# Patient Record
Sex: Male | Born: 1995 | Hispanic: No | Smoking: Never smoker
Health system: Southern US, Community
[De-identification: ages and names within clinical notes are randomized; demographics above are authoritative.]

## PROBLEM LIST (undated history)

## (undated) DIAGNOSIS — F39 Unspecified mood [affective] disorder: Secondary | ICD-10-CM

## (undated) DIAGNOSIS — F32A Depression, unspecified: Secondary | ICD-10-CM

## (undated) DIAGNOSIS — F419 Anxiety disorder, unspecified: Secondary | ICD-10-CM

## (undated) DIAGNOSIS — F329 Major depressive disorder, single episode, unspecified: Secondary | ICD-10-CM

## (undated) DIAGNOSIS — K222 Esophageal obstruction: Secondary | ICD-10-CM

## (undated) HISTORY — DX: Anxiety disorder, unspecified: F41.9

---
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
## 2017-03-12 ENCOUNTER — Emergency Department (HOSPITAL_COMMUNITY): Payer: Medicaid Other | Admitting: Anesthesiology

## 2017-03-12 ENCOUNTER — Emergency Department (HOSPITAL_BASED_OUTPATIENT_CLINIC_OR_DEPARTMENT_OTHER): Payer: Medicaid Other

## 2017-03-12 ENCOUNTER — Encounter (HOSPITAL_BASED_OUTPATIENT_CLINIC_OR_DEPARTMENT_OTHER): Payer: Self-pay | Admitting: *Deleted

## 2017-03-12 ENCOUNTER — Ambulatory Visit (HOSPITAL_BASED_OUTPATIENT_CLINIC_OR_DEPARTMENT_OTHER)
Admission: EM | Admit: 2017-03-12 | Discharge: 2017-03-12 | Disposition: A | Discharge status: Home / Self Care | Payer: Medicaid Other | Attending: Emergency Medicine | Admitting: Emergency Medicine

## 2017-03-12 ENCOUNTER — Encounter (HOSPITAL_COMMUNITY)
Admission: EM | Disposition: A | Discharge status: Home / Self Care | Payer: Self-pay | Source: Home / Self Care | Attending: Emergency Medicine

## 2017-03-12 DIAGNOSIS — K449 Diaphragmatic hernia without obstruction or gangrene: Secondary | ICD-10-CM | POA: Diagnosis not present

## 2017-03-12 DIAGNOSIS — K210 Gastro-esophageal reflux disease with esophagitis: Secondary | ICD-10-CM | POA: Insufficient documentation

## 2017-03-12 DIAGNOSIS — K222 Esophageal obstruction: Secondary | ICD-10-CM | POA: Diagnosis not present

## 2017-03-12 DIAGNOSIS — T18128A Food in esophagus causing other injury, initial encounter: Secondary | ICD-10-CM

## 2017-03-12 DIAGNOSIS — X58XXXA Exposure to other specified factors, initial encounter: Secondary | ICD-10-CM | POA: Insufficient documentation

## 2017-03-12 HISTORY — PX: ESOPHAGOGASTRODUODENOSCOPY: SHX5428

## 2017-03-12 HISTORY — DX: Esophageal obstruction: K22.2

## 2017-03-12 HISTORY — DX: Unspecified mood (affective) disorder: F39

## 2017-03-12 HISTORY — DX: Major depressive disorder, single episode, unspecified: F32.9

## 2017-03-12 HISTORY — DX: Depression, unspecified: F32.A

## 2017-03-12 SURGERY — EGD (ESOPHAGOGASTRODUODENOSCOPY)
Anesthesia: Monitor Anesthesia Care

## 2017-03-12 MED ORDER — GLUCAGON HCL RDNA (DIAGNOSTIC) 1 MG IJ SOLR
INTRAMUSCULAR | Status: AC
  Filled 2017-03-12: qty 1

## 2017-03-12 MED ORDER — PROPOFOL 10 MG/ML IV BOLUS
INTRAVENOUS | Status: AC
  Filled 2017-03-12: qty 20

## 2017-03-12 MED ORDER — PROPOFOL 500 MG/50ML IV EMUL
INTRAVENOUS | Status: DC | PRN
  Administered 2017-03-12: 120 ug/kg/min via INTRAVENOUS

## 2017-03-12 MED ORDER — PROPOFOL 10 MG/ML IV BOLUS
INTRAVENOUS | Status: DC | PRN
  Administered 2017-03-12: 20 mg via INTRAVENOUS
  Administered 2017-03-12 (×2): 40 mg via INTRAVENOUS
  Administered 2017-03-12: 20 mg via INTRAVENOUS
  Administered 2017-03-12 (×2): 40 mg via INTRAVENOUS
  Administered 2017-03-12 (×2): 20 mg via INTRAVENOUS

## 2017-03-12 MED ORDER — GLUCAGON HCL RDNA (DIAGNOSTIC) 1 MG IJ SOLR
1.0000 mg | Freq: Once | INTRAMUSCULAR | Status: AC
  Administered 2017-03-12: 1 mg via INTRAVENOUS

## 2017-03-12 MED ORDER — SODIUM CHLORIDE 0.9 % IV SOLN
INTRAVENOUS | Status: DC
  Administered 2017-03-12: 22:00:00 via INTRAVENOUS

## 2017-03-12 NOTE — Anesthesia Preprocedure Evaluation (Addendum)
Anesthesia Evaluation  Patient identified by MRN, date of birth, ID band Patient awake    Reviewed: Allergy & Precautions, NPO status , Unable to perform ROS - Chart review only  Airway Mallampati: II  TM Distance: >3 FB Neck ROM: Full    Dental  (+) Teeth Intact, Dental Advisory Given   Pulmonary    breath sounds clear to auscultation       Cardiovascular negative cardio ROS   Rhythm:Regular Rate:Normal     Neuro/Psych PSYCHIATRIC DISORDERS Anxiety Depression    GI/Hepatic negative GI ROS, Neg liver ROS,   Endo/Other  negative endocrine ROS  Renal/GU negative Renal ROS  negative genitourinary   Musculoskeletal negative musculoskeletal ROS (+)   Abdominal   Peds negative pediatric ROS (+)  Hematology negative hematology ROS (+)   Anesthesia Other Findings   Reproductive/Obstetrics negative OB ROS                            Anesthesia Physical Anesthesia Plan  ASA: II and emergent  Anesthesia Plan: MAC   Post-op Pain Management:    Induction: Intravenous  Airway Management Planned: Natural Airway  Additional Equipment:   Intra-op Plan:   Post-operative Plan:   Informed Consent: I have reviewed the patients History and Physical, chart, labs and discussed the procedure including the risks, benefits and alternatives for the proposed anesthesia with the patient or authorized representative who has indicated his/her understanding and acceptance.     Plan Discussed with: CRNA  Anesthesia Plan Comments:         Anesthesia Quick Evaluation

## 2017-03-12 NOTE — Anesthesia Postprocedure Evaluation (Addendum)
Anesthesia Post Note  Patient: Jacob Salazar  Procedure(s) Performed: Procedure(s) (LRB): ESOPHAGOGASTRODUODENOSCOPY (EGD) (N/A)  Patient location during evaluation: PACU Anesthesia Type: MAC Level of consciousness: awake and alert Pain management: pain level controlled Vital Signs Assessment: post-procedure vital signs reviewed and stable Respiratory status: spontaneous breathing, nonlabored ventilation, respiratory function stable and patient connected to nasal cannula oxygen Cardiovascular status: stable and blood pressure returned to baseline Anesthetic complications: no       Last Vitals:  Vitals:   03/12/17 2250 03/12/17 2300  BP: 122/60 (!) 127/57  Pulse: 96 92  Resp: (!) 23 18  Temp: 36.8 C     Last Pain:  Vitals:   03/12/17 2250  TempSrc: Oral  PainSc:                  Effie Berkshire

## 2017-03-12 NOTE — ED Triage Notes (Signed)
Pt reports steak stuck in throat since 1230. Spitting in cup. Speaking full sentences. No distress noted at nursefirst

## 2017-03-12 NOTE — ED Provider Notes (Signed)
Emergency Department Provider Note  By signing my name below, I, Soijett Blue, attest that this documentation has been prepared under the direction and in the presence of Maia Plan, MD. Electronically Signed: Soijett Blue, ED Scribe. 03/12/17. 7:58 PM.  I have reviewed the triage vital signs and the nursing notes.   HISTORY  Chief Complaint Esophageal Stricture   HPI Jacob Salazar is a 21 y.o. male with a PMHx of esophageal structure, who presents to the Emergency Department complaining of foreign body sensation in throat onset 6.5 hours ago. Pt reports associated trouble swallowing due to foreign body sensation. Pt has not tried any medications for the relief of his symptoms. He states that he was eating steak at a restaurant when a portion of the steak became lodged in his throat. Pt reports that he has a past hx of similar symptoms that resolved on its own without treatment or evaluation. He denies nausea and any other symptoms.    Past Medical History:  Diagnosis Date  . Anxiety   . Depression   . Esophageal stricture   . Mood disorder [facility])     Patient Active Problem List   Diagnosis Date Noted  . Anxiety, generalized 01/15/2012    History reviewed. No pertinent surgical history.    Allergies Grassleaf sweetflag rhizome  Family History  Problem Relation Age of Onset  . Depression Mother     Social History Social History  Substance Use Topics  . Smoking status: Never Smoker  . Smokeless tobacco: Never Used  . Alcohol use No    Review of Systems Constitutional: No fever/chills Eyes: No visual changes. ENT: +FB sensation in throat. +Trouble swallowing due to FB sensation. No sore throat. Cardiovascular: Denies chest pain. Respiratory: Denies shortness of breath. Gastrointestinal: No abdominal pain. No nausea, no vomiting. No diarrhea. No constipation. Genitourinary: Negative for dysuria. Musculoskeletal: Negative for back pain. Skin: Negative  for rash. Neurological: Negative for headaches, focal weakness or numbness.  10-point ROS otherwise negative.  ____________________________________________   PHYSICAL EXAM:  VITAL SIGNS: ED Triage Vitals  Enc Vitals Group     BP 03/12/17 1857 120/81     Pulse Rate 03/12/17 1857 87     Resp 03/12/17 1857 20     Temp 03/12/17 1857 98.5 F (36.9 C)     Temp Source 03/12/17 1857 Oral     SpO2 03/12/17 1857 100 %     Weight 03/12/17 1854 220 lb (99.8 kg)     Height 03/12/17 1854  (1.702 m)     Pain Score 03/12/17 1854 8   Constitutional: Alert and oriented. Well appearing and in no acute distress. Eyes: Conjunctivae are normal.  Head: Atraumatic. Nose: No congestion/rhinnorhea. Mouth/Throat: Mucous membranes are moist.  Oropharynx non-erythematous. Drooling into cup at bedside.  Neck: No stridor.  Voice is slightly muffled.  Cardiovascular: Normal rate, regular rhythm. Good peripheral circulation. Grossly normal heart sounds.   Respiratory: Normal respiratory effort.  No retractions. Lungs CTAB. Gastrointestinal: Soft and nontender. No distention.  Musculoskeletal: No lower extremity tenderness nor edema. No gross deformities of extremities. Neurologic:  Normal language. No gross focal neurologic deficits are appreciated.  Skin:  Skin is warm, dry and intact. No rash noted.  ____________________________________________  RADIOLOGY  No results found.  ____________________________________________   PROCEDURES  Procedure(s) performed:   Procedures  None ____________________________________________   INITIAL IMPRESSION / ASSESSMENT AND PLAN / ED COURSE  Pertinent labs & imaging results that were available during my care  of the patient were reviewed by me and considered in my medical decision making (see chart for details).  Patient presents to the ED with apparent food impaction. Patient unable to tolerate even oral secretions. No apparent airway involvement.  Plain film of neck is normal. Glucagon given and not successful. Will require transfer for endoscopy.   08:44 PM Spoke with GI, Dr. Ewing Schlein regarding the patient. Grandmother is here and will drive to [facility] endoscopy for evaluation. Discussed risk and benefit of transfer. Grandmother is comfortable driving. IV secured and directions to facility confirmed. Completed EMTALA.    ____________________________________________  FINAL CLINICAL IMPRESSION(S) / ED DIAGNOSES  Final diagnoses:  Food impaction of esophagus, initial encounter     MEDICATIONS GIVEN DURING THIS VISIT:  Medications  glucagon (human recombinant) (GLUCAGEN) injection 1 mg (1 mg Intravenous Given 03/12/17 1935)    I personally performed the services described in this documentation, which was scribed in my presence. The recorded information has been reviewed and is accurate.     Note:  This document was prepared using Dragon voice recognition software and may include unintentional dictation errors.  Alona Bene, MD Emergency Medicine    Maia Plan, MD 03/14/17 1440

## 2017-03-12 NOTE — ED Triage Notes (Signed)
He was eating steak and a piece got lodged in his esophagus 6 hours ago. He is unable to swallow saliva or water. Hx of same in the past.

## 2017-03-12 NOTE — ED Notes (Signed)
PT to go by POV to [facility] enod suite

## 2017-03-12 NOTE — Consult Note (Signed)
Reason for Consult: Food impaction Referring Physician: ER physician  Jacob Salazar is an 21 y.o. male.  HPI: Patient seen and examined and case discussed with him in multiple family members and he has a long history of dysphagia but has not had a test yet and has been on various pump inhibitors which have not been helpful but is always been able to get the food back up until now any last took a bite of country fried steak at 12:30 today and he went to the emergency room and glucagon was not helpful and we were called for consideration of endoscopy and he has no other complaints  Past Medical History:  Diagnosis Date  . Anxiety   . Depression   . Esophageal stricture   . Mood disorder (HCC)     History reviewed. No pertinent surgical history.  Family History  Problem Relation Age of Onset  . Depression Mother     Social History:  reports that he has never smoked. He has never used smokeless tobacco. He reports that he does not drink alcohol or use drugs.  Allergies:  Allergies  Allergen Reactions  . Grassleaf Sweetflag Rhizome     Medications: I have reviewed the patient's current medications.  No results found for this or any previous visit (from the past 48 hour(s)).  Dg Neck Soft Tissue  Result Date: 03/12/2017 CLINICAL DATA:  Patient swallowed a piece of steak and got stuck in esophagus. Patient is unable to swallow saliva and having esophageal pain. EXAM: NECK SOFT TISSUES - 1+ VIEW COMPARISON:  None. FINDINGS: There is no evidence of retropharyngeal soft tissue swelling or epiglottic enlargement. The cervical airway is unremarkable and no radio-opaque foreign body identified. IMPRESSION: Negative. Electronically Signed   By: Elberta Fortis M.D.   On: 03/12/2017 19:53    ROS negative except above Blood pressure 125/64, pulse 84, temperature 98.6 F (37 C), temperature source Oral, resp. rate (!) 24, height  (1.702 m), weight 99.8 kg (220 lb), SpO2 100 %. Physical  Exam vital signs stable afebrile no acute distress exam please see preassessment evaluation x-ray reviewed  Assessment/Plan: Food impaction in a patient with a long history of dysphagia but no workup to date Plan: The risks benefits and methods of endoscopy and removal was discussed with the patient and his family and will proceed now with anesthesia assistance with further workup and plans pending those findings  [name] E 03/12/2017, 10:26 PM

## 2017-03-12 NOTE — ED Notes (Signed)
Report given to [facility] ED , no answer at endo suite

## 2017-03-12 NOTE — Op Note (Signed)
[facility] Patient Name: Jacob Salazar Procedure Date: 03/12/2017 MRN: [id number] Attending MD: Vida Rigger , MD Date of Birth: November 07, 1996 CSN: [id number] Age: 21 Admit Type: Outpatient Procedure:                Upper GI endoscopy Indications:              Foreign body in the esophagus Providers:                Vida Rigger, MD, Harold Barban, RN, Beryle Beams,                            Technician Referring MD:              Medicines:                Propofol total dose 440 mg IV Complications:            No immediate complications. Estimated Blood Loss:     Estimated blood loss: none. Procedure:                Pre-Anesthesia Assessment:                           - Prior to the procedure, a History and Physical                            was performed, and patient medications and                            allergies were reviewed. The patient's tolerance of                            previous anesthesia was also reviewed. The risks                            and benefits of the procedure and the sedation                            options and risks were discussed with the patient.                            All questions were answered, and informed consent                            was obtained. Prior Anticoagulants: The patient has                            taken no previous anticoagulant or antiplatelet                            agents. ASA Grade Assessment: II - A patient with                            mild systemic disease. After reviewing the risks  and benefits, the patient was deemed in                            satisfactory condition to undergo the procedure.                           After obtaining informed consent, the endoscope was                            passed under direct vision. Throughout the                            procedure, the patient's blood pressure, pulse, and                            oxygen saturations  were monitored continuously. The                            was introduced through the mouth, and advanced to                            the second part of duodenum. The upper GI endoscopy                            was somewhat difficult due to presence of food. The                            patient tolerated the procedure fairly well. Scope In: Scope Out: Findings:      The larynx was normal.      Food was found in the proximal esophagus. Removal was accomplished with       a Lucina Mellow net -2 and snare.      A small hiatal hernia was present.      LA Grade A (one or more mucosal breaks less than 5 mm, not extending       between tops of 2 mucosal folds) esophagitis with no bleeding was found.      One mild benign-appearing, intrinsic stenosis was found. And was       traversed.      The entire examined stomach was normal.      The duodenal bulb, first portion of the duodenum and second portion of       the duodenum were normal.      The exam was otherwise without abnormality. Impression:               - Normal larynx.                           - Food was found in the esophagus. Removal was                            successful.                           - Small hiatal hernia.                           -  LA Grade A reflux esophagitis.                           - Benign-appearing esophageal stenosis.                           - Normal stomach.                           - Normal duodenal bulb, first portion of the                            duodenum and second portion of the duodenum.                           - The examination was otherwise normal. Moderate Sedation:      N/A- Per Anesthesia Care Recommendation:           - Patient has a contact number available for                            emergencies. The signs and symptoms of potential                            delayed complications were discussed with the                            patient. Return to normal activities tomorrow.                             Written discharge instructions were provided to the                            patient.                           - Clear liquid diet today.                           - Continue present medications.                           - Return to GI clinic in 2 weeks.                           - Telephone GI clinic if symptomatic PRN. Procedure Code(s):        --- Professional ---                           [id number], Esophagogastroduodenoscopy, flexible,                            transoral; with removal of foreign body(s) Diagnosis Code(s):        --- Professional ---                           V78.469G, Food in  esophagus causing other injury,                            initial encounter                           K44.9, Diaphragmatic hernia without obstruction or                            gangrene                           K21.0, Gastro-esophageal reflux disease with                            esophagitis                           K22.2, Esophageal obstruction                           T18.108A, Unspecified foreign body in esophagus                            causing other injury, initial encounter CPT copyright 2016 [employer]. All rights reserved. The codes documented in this report are preliminary and upon coder review may  be revised to meet current compliance requirements. Vida Rigger, MD 03/12/2017 10:55:13 PM This report has been signed electronically. Number of Addenda: 0

## 2017-03-12 NOTE — Discharge Instructions (Signed)
YOU HAD AN ENDOSCOPIC PROCEDURE TODAY: Refer to the procedure report and other information in the discharge instructions given to you for any specific questions about what was found during the examination. If this information does not answer your questions, please call [facility] office at [phone] to clarify.   YOU SHOULD EXPECT: Some feelings of bloating in the abdomen. Passage of more gas than usual. Walking can help get rid of the air that was put into your GI tract during the procedure and reduce the bloating. If you had a lower endoscopy (such as a colonoscopy or flexible sigmoidoscopy) you may notice spotting of blood in your stool or on the toilet paper. Some abdominal soreness may be present for a day or two, also.  DIET: Your first meal following the procedure should be a light meal and then it is ok to progress to your normal diet. A half-sandwich or bowl of soup is an example of a good first meal. Heavy or fried foods are harder to digest and may make you feel nauseous or bloated. Drink plenty of fluids but you should avoid alcoholic beverages for 24 hours. If you had a esophageal dilation, please see attached instructions for diet.   ACTIVITY: Your care partner should take you home directly after the procedure. You should plan to take it easy, moving slowly for the rest of the day. You can resume normal activity the day after the procedure however YOU SHOULD NOT DRIVE, use power tools, machinery or perform tasks that involve climbing or major physical exertion for 24 hours (because of the sedation medicines used during the test).   SYMPTOMS TO REPORT IMMEDIATELY: A gastroenterologist can be reached at any hour. Please call [phone]  for any of the following symptoms:  Following lower endoscopy (colonoscopy, flexible sigmoidoscopy) Excessive amounts of blood in the stool  Significant tenderness, worsening of abdominal pains  Swelling of the abdomen that is new, acute  Fever of 100 or  higher  Following upper endoscopy (EGD, EUS, ERCP, esophageal dilation) Vomiting of blood or coffee ground material  New, significant abdominal pain  New, significant chest pain or pain under the shoulder blades  Painful or persistently difficult swallowing  New shortness of breath  Black, tarry-looking or red, bloody stools  FOLLOW UP:  If any biopsies were taken you will be contacted by phone or by letter within the next 1-3 weeks. Call [phone]  if you have not heard about the biopsies in 3 weeks.  Please also call with any specific questions about appointments or follow up tests.   Liquids only today soft solids the rest of this weekend may advance diet next week eat slowly and chew foods well cut in small pieces drink plenty of liquids while eating and increase Nexium to twice a day and call me if needed otherwise follow-up in the office in a few weeks to set up a repeat endoscopy and probable dilation or set up an appointment with a local gastroenterologist ASAP

## 2017-03-12 NOTE — Transfer of Care (Signed)
Immediate Anesthesia Transfer of Care Note  Patient: Jacob Salazar  Procedure(s) Performed: Procedure(s): ESOPHAGOGASTRODUODENOSCOPY (EGD) (N/A)  Patient Location: PACU  Anesthesia Type:General  Level of Consciousness:  sedated, patient cooperative and responds to stimulation  Airway & Oxygen Therapy:Patient Spontanous Breathing and Patient connected to face mask oxgen  Post-op Assessment:  Report given to PACU RN and Post -op Vital signs reviewed and stable  Post vital signs:  Reviewed and stable  Last Vitals:  Vitals:   03/12/17 2214 03/12/17 2250  BP: 125/64 122/60  Pulse: 84 96  Resp: (!) 24 (!) 23  Temp: 37 C     Complications: No apparent anesthesia complications

## 2017-03-16 ENCOUNTER — Encounter (HOSPITAL_COMMUNITY): Payer: Self-pay | Admitting: Gastroenterology

## 2017-05-07 NOTE — Addendum Note (Signed)
Addendum  created 05/07/17 1223 by Hollis, Kevin D, MD   Sign clinical note    

## 2017-10-23 ENCOUNTER — Emergency Department (HOSPITAL_COMMUNITY)
Admission: EM | Admit: 2017-10-23 | Discharge: 2017-10-23 | Disposition: A | Discharge status: Home / Self Care | Payer: 59 | Attending: Emergency Medicine | Admitting: Emergency Medicine

## 2017-10-23 ENCOUNTER — Encounter (HOSPITAL_COMMUNITY): Payer: Self-pay | Admitting: Emergency Medicine

## 2017-10-23 ENCOUNTER — Other Ambulatory Visit: Payer: Self-pay

## 2017-10-23 DIAGNOSIS — Y929 Unspecified place or not applicable: Secondary | ICD-10-CM | POA: Diagnosis not present

## 2017-10-23 DIAGNOSIS — T18128A Food in esophagus causing other injury, initial encounter: Secondary | ICD-10-CM | POA: Insufficient documentation

## 2017-10-23 DIAGNOSIS — Z79899 Other long term (current) drug therapy: Secondary | ICD-10-CM | POA: Diagnosis not present

## 2017-10-23 DIAGNOSIS — X58XXXA Exposure to other specified factors, initial encounter: Secondary | ICD-10-CM | POA: Insufficient documentation

## 2017-10-23 DIAGNOSIS — Z91040 Latex allergy status: Secondary | ICD-10-CM | POA: Diagnosis not present

## 2017-10-23 DIAGNOSIS — Y9389 Activity, other specified: Secondary | ICD-10-CM | POA: Insufficient documentation

## 2017-10-23 DIAGNOSIS — Z8719 Personal history of other diseases of the digestive system: Secondary | ICD-10-CM | POA: Insufficient documentation

## 2017-10-23 DIAGNOSIS — R0989 Other specified symptoms and signs involving the circulatory and respiratory systems: Secondary | ICD-10-CM | POA: Diagnosis not present

## 2017-10-23 DIAGNOSIS — Y998 Other external cause status: Secondary | ICD-10-CM | POA: Insufficient documentation

## 2017-10-23 NOTE — ED Triage Notes (Signed)
Patient has a hx of having steak stuck in his throat. Patient went to a restaurant around 5:30 pm. Patient states it feels like something is stuck in his throat and hurts while he breathes.

## 2017-10-23 NOTE — ED Notes (Signed)
States feeling better able to drink water with vomiting it back up, no respiratory or acute distress noted alert and oriented x 3 visitor at bedside. No drooling noted clear speech noted.

## 2017-10-23 NOTE — ED Notes (Signed)
Pt left without receiving discharge instructions or having vitals rechecked.

## 2017-10-23 NOTE — ED Provider Notes (Signed)
[facility]-EMERGENCY DEPT Provider Note   CSN: [id number] Arrival date & time: 10/23/17  1813     History   Chief Complaint Chief Complaint  Patient presents with  . rib stuck in throat    HPI Jacob Salazar is a 21 y.o. male.  21 year old male with history of anxiety/depression and esophageal stricture who presents with food stuck in his throat.  This evening around 5:30 PM, the patient took a bite of red meat and accidentally swallowed it before chewing.  He felt like the food got stuck in his throat and was unable to go down.  The pain was worse when he took a breath in.  Initially he was having to spit out his secretions.  While sitting in the ED, he eventually felt his symptoms resolved.  Now he feels well and no longer has the foreign body sensation in his throat.  He has been able to drink water here without problems and no vomiting.  Mom notes that he has had this problem previously, last year had a piece of steak stuck in his throat and had to have endoscopy.  No problems since then.   The history is provided by the patient.    Past Medical History:  Diagnosis Date  . Anxiety   . Depression   . Esophageal stricture   . Mood disorder [facility](HCC)     Patient Active Problem List   Diagnosis Date Noted  . Anxiety, generalized 01/15/2012    Past Surgical History:  Procedure Laterality Date  . ESOPHAGOGASTRODUODENOSCOPY (EGD) N/A 03/12/2017   Performed [facility] Vida RiggerMagod, Marc, [facility]       Home Medications    Prior to Admission medications   Medication Sig Start Date End Date Taking? Authorizing Provider  ARIPiprazole (ABILIFY) 10 MG tablet Take 10 mg by mouth daily.   Yes [provider]  cetirizine (ZYRTEC) 10 MG tablet Take 10 mg by mouth daily.   Yes [provider]  esomeprazole (NEXIUM) 40 MG capsule Take 40 mg by mouth daily at 12 noon.   Yes [provider]  lamoTRIgine (LAMICTAL) 100 MG tablet Take 100 mg  by mouth daily.   Yes [provider]  traZODone (DESYREL) 100 MG tablet Take 150 mg at bedtime by mouth. 10/15/17  Yes [provider]    Family History Family History  Problem Relation Age of Onset  . Depression Mother     Social History Social History   Tobacco Use  . Smoking status: Never Smoker  . Smokeless tobacco: Never Used  Substance Use Topics  . Alcohol use: No  . Drug use: No     Allergies   Grassleaf sweetflag rhizome and Latex   Review of Systems Review of Systems All other systems reviewed and are negative except that which was mentioned in HPI   Physical Exam Updated Vital Signs BP 127/66 (BP Location: Left Arm)   Pulse 90   Temp 98 F (36.7 C) (Oral)   Resp 18   Ht 5\' 6"  (1.676 m)   Wt 104.3 kg (230 lb)   SpO2 98%   BMI 37.12 kg/m   Physical Exam  Constitutional: He is oriented to person, place, and time. He appears well-developed and well-nourished. No distress.  HENT:  Head: Normocephalic and atraumatic.  Mouth/Throat: Oropharynx is clear and moist.  Moist mucous membranes  Eyes: Conjunctivae are normal. Pupils are equal, round, and reactive to light.  Neck: Neck supple.  Cardiovascular: Normal  rate, regular rhythm and normal heart sounds.  No murmur heard. Pulmonary/Chest: Effort normal and breath sounds normal.  Abdominal: Soft. Bowel sounds are normal. He exhibits no distension. There is no tenderness.  Musculoskeletal: He exhibits no edema.  Neurological: He is alert and oriented to person, place, and time.  Fluent speech  Skin: Skin is warm and dry.  Psychiatric: He has a normal mood and affect. Judgment normal.  Nursing note and vitals reviewed.    ED Treatments / Results  Labs (all labs ordered are listed, but only abnormal results are displayed) Labs Reviewed - No data to display  EKG  EKG Interpretation None       Radiology No results found.  Procedures Procedures (including critical care  time)  Medications Ordered in ED Medications - No data to display   Initial Impression / Assessment and Plan / ED Course  I have reviewed the triage vital signs and the nursing notes.     PT w/ food impaction that resolved while ED without intervention.  He was well-appearing, tolerating secretions, and drank a cup of water with no issues.  Extensively educated the patient on the importance of cutting food into small bites and being very cautious with meats.  Instructed to follow-up with gastroenterology as needed.  Return precautions given.  Final Clinical Impressions(s) / ED Diagnoses   Final diagnoses:  Food impaction of esophagus, initial encounter    ED Discharge Orders    None       Quinley Nesler, Ambrose Finlandachel Morgan, MD 10/23/17 2052

## 2018-02-27 ENCOUNTER — Emergency Department (HOSPITAL_COMMUNITY): Payer: 59 | Admitting: Anesthesiology

## 2018-02-27 ENCOUNTER — Other Ambulatory Visit: Payer: Self-pay

## 2018-02-27 ENCOUNTER — Emergency Department (HOSPITAL_COMMUNITY)
Admission: EM | Admit: 2018-02-27 | Discharge: 2018-02-27 | Disposition: A | Discharge status: Home / Self Care | Payer: 59 | Attending: Emergency Medicine | Admitting: Emergency Medicine

## 2018-02-27 ENCOUNTER — Encounter (HOSPITAL_COMMUNITY): Payer: Self-pay | Admitting: *Deleted

## 2018-02-27 ENCOUNTER — Encounter (HOSPITAL_COMMUNITY)
Admission: EM | Disposition: A | Discharge status: Home / Self Care | Payer: Self-pay | Source: Home / Self Care | Attending: Emergency Medicine

## 2018-02-27 DIAGNOSIS — R1310 Dysphagia, unspecified: Secondary | ICD-10-CM

## 2018-02-27 DIAGNOSIS — F840 Autistic disorder: Secondary | ICD-10-CM | POA: Insufficient documentation

## 2018-02-27 DIAGNOSIS — K222 Esophageal obstruction: Secondary | ICD-10-CM | POA: Diagnosis not present

## 2018-02-27 DIAGNOSIS — Z79899 Other long term (current) drug therapy: Secondary | ICD-10-CM | POA: Diagnosis not present

## 2018-02-27 DIAGNOSIS — X58XXXA Exposure to other specified factors, initial encounter: Secondary | ICD-10-CM | POA: Insufficient documentation

## 2018-02-27 DIAGNOSIS — F411 Generalized anxiety disorder: Secondary | ICD-10-CM | POA: Diagnosis not present

## 2018-02-27 DIAGNOSIS — F319 Bipolar disorder, unspecified: Secondary | ICD-10-CM | POA: Insufficient documentation

## 2018-02-27 DIAGNOSIS — T18128A Food in esophagus causing other injury, initial encounter: Secondary | ICD-10-CM | POA: Diagnosis not present

## 2018-02-27 DIAGNOSIS — K209 Esophagitis, unspecified: Secondary | ICD-10-CM | POA: Diagnosis not present

## 2018-02-27 HISTORY — PX: ESOPHAGOGASTRODUODENOSCOPY: SHX5428

## 2018-02-27 SURGERY — EGD (ESOPHAGOGASTRODUODENOSCOPY)
Anesthesia: General

## 2018-02-27 MED ORDER — PHENYLEPHRINE 40 MCG/ML (10ML) SYRINGE FOR IV PUSH (FOR BLOOD PRESSURE SUPPORT)
PREFILLED_SYRINGE | INTRAVENOUS | Status: DC | PRN
  Administered 2018-02-27: 120 ug via INTRAVENOUS
  Administered 2018-02-27: 80 ug via INTRAVENOUS

## 2018-02-27 MED ORDER — GLUCAGON HCL RDNA (DIAGNOSTIC) 1 MG IJ SOLR
1.0000 mg | INTRAMUSCULAR | Status: AC | PRN
  Administered 2018-02-27 (×2): 1 mg via INTRAVENOUS
  Filled 2018-02-27 (×2): qty 1

## 2018-02-27 MED ORDER — SODIUM CHLORIDE 0.9 % IV SOLN: INTRAVENOUS | Status: DC

## 2018-02-27 MED ORDER — SUGAMMADEX SODIUM 200 MG/2ML IV SOLN
INTRAVENOUS | Status: DC | PRN
  Administered 2018-02-27: 100 mg via INTRAVENOUS

## 2018-02-27 MED ORDER — DEXAMETHASONE SODIUM PHOSPHATE 10 MG/ML IJ SOLN
INTRAMUSCULAR | Status: DC | PRN
  Administered 2018-02-27: 10 mg via INTRAVENOUS

## 2018-02-27 MED ORDER — PROPOFOL 10 MG/ML IV BOLUS
INTRAVENOUS | Status: DC | PRN
  Administered 2018-02-27: 200 mg via INTRAVENOUS

## 2018-02-27 MED ORDER — SUCCINYLCHOLINE CHLORIDE 200 MG/10ML IV SOSY
PREFILLED_SYRINGE | INTRAVENOUS | Status: DC | PRN
  Administered 2018-02-27: 100 mg via INTRAVENOUS

## 2018-02-27 MED ORDER — FENTANYL CITRATE (PF) 100 MCG/2ML IJ SOLN
INTRAMUSCULAR | Status: AC
  Filled 2018-02-27: qty 2

## 2018-02-27 MED ORDER — LIDOCAINE 2% (20 MG/ML) 5 ML SYRINGE
INTRAMUSCULAR | Status: DC | PRN
  Administered 2018-02-27: 100 mg via INTRAVENOUS

## 2018-02-27 MED ORDER — PROPOFOL 10 MG/ML IV BOLUS
INTRAVENOUS | Status: AC
  Filled 2018-02-27: qty 40

## 2018-02-27 MED ORDER — SODIUM CHLORIDE 0.9 % IV SOLN
INTRAVENOUS | Status: DC
  Administered 2018-02-27 (×2): via INTRAVENOUS

## 2018-02-27 MED ORDER — ROCURONIUM BROMIDE 10 MG/ML (PF) SYRINGE
PREFILLED_SYRINGE | INTRAVENOUS | Status: DC | PRN
  Administered 2018-02-27: 10 mg via INTRAVENOUS

## 2018-02-27 MED ORDER — ONDANSETRON HCL 4 MG/2ML IJ SOLN
INTRAMUSCULAR | Status: DC | PRN
  Administered 2018-02-27: 4 mg via INTRAVENOUS

## 2018-02-27 MED ORDER — FENTANYL CITRATE (PF) 250 MCG/5ML IJ SOLN
INTRAMUSCULAR | Status: DC | PRN
  Administered 2018-02-27: 50 ug via INTRAVENOUS

## 2018-02-27 MED ORDER — MIDAZOLAM HCL 2 MG/2ML IJ SOLN
INTRAMUSCULAR | Status: AC
  Filled 2018-02-27: qty 2

## 2018-02-27 NOTE — ED Notes (Signed)
Report given to Casimiro NeedleMichael, PACU RN. He is assuming care of pt until endo can perform his procedure.

## 2018-02-27 NOTE — ED Provider Notes (Signed)
[facility]-EMERGENCY DEPT Provider Note   CSN: [id number] Arrival date & time: 02/27/18  1332   The patient presents for evaluation of difficulty swallowing which started around noon today after eating a chicken pot.  Since that time he has been spitting up saliva, and unable to swallow.  He has had this recurrently.  No other recent illnesses including fever, chills, cough, chest pain, weakness dizziness.  He is here with his mother who gives history, because "he has an intellectual disability," my mother's report.  She also reports that he has had to have esophageal food bolus extractions multiple times.  He has esophagitis for which she is taking Nexium, twice a day.  There are no other known modifying factors.      History   Chief Complaint Chief Complaint  Patient presents with  . food bolus    HPI Jacob Salazar is a 22 y.o. male.  HPI  Past Medical History:  Diagnosis Date  . Anxiety   . Depression   . Esophageal stricture   . Mood disorder [facility])     Patient Active Problem List   Diagnosis Date Noted  . Anxiety, generalized 01/15/2012    Past Surgical History:  Procedure Laterality Date  . ESOPHAGOGASTRODUODENOSCOPY N/A 03/12/2017   Procedure: ESOPHAGOGASTRODUODENOSCOPY (EGD);  Surgeon: Vida Rigger, MD;  Location: [facility];  Service: Endoscopy;  Laterality: N/A;        Home Medications    Prior to Admission medications   Medication Sig Start Date End Date Taking? Authorizing Provider  ARIPiprazole (ABILIFY) 10 MG tablet Take 10 mg by mouth daily.   Yes [provider]  cetirizine (ZYRTEC) 10 MG tablet Take 10 mg by mouth daily.   Yes [provider]  esomeprazole (NEXIUM) 40 MG capsule Take 40 mg by mouth daily at 12 noon.   Yes [provider]  hydrOXYzine (VISTARIL) 25 MG capsule Take 25 mg by mouth daily. 01/28/18  Yes [provider]  lamoTRIgine (LAMICTAL) 100 MG tablet Take 100 mg by mouth  daily.   Yes [provider]  traZODone (DESYREL) 100 MG tablet Take 150 mg at bedtime by mouth. 10/15/17  Yes [provider]  venlafaxine XR (EFFEXOR-XR) 75 MG 24 hr capsule Take 75 mg by mouth daily. 12/02/17  Yes [provider]    Family History Family History  Problem Relation Age of Onset  . Depression Mother     Social History Social History   Tobacco Use  . Smoking status: Never Smoker  . Smokeless tobacco: Never Used  Substance Use Topics  . Alcohol use: No  . Drug use: No     Allergies   Grassleaf sweetflag rhizome and Latex   Review of Systems Review of Systems  All other systems reviewed and are negative.    Physical Exam Updated Vital Signs BP 110/64   Pulse 94   Resp 16   SpO2 99%   Physical Exam  Constitutional: He is oriented to person, place, and time. He appears well-developed and well-nourished. No distress.  HENT:  Head: Normocephalic and atraumatic.  Right Ear: External ear normal.  Left Ear: External ear normal.  Eyes: Pupils are equal, round, and reactive to light. Conjunctivae and EOM are normal.  Neck: Normal range of motion and phonation normal. Neck supple.  Cardiovascular: Normal rate, regular rhythm and normal heart sounds.  Pulmonary/Chest: Effort normal and breath sounds normal. He exhibits no bony tenderness.  Abdominal: Soft. There is no tenderness.  He is spitting clear saliva  Musculoskeletal: Normal range of motion.  Neurological: He is alert and oriented to person, place, and time. No cranial nerve deficit or sensory deficit. He exhibits normal muscle tone. Coordination normal.  Skin: Skin is warm, dry and intact.  Psychiatric: He has a normal mood and affect. His behavior is normal.  Nursing note and vitals reviewed.    ED Treatments / Results  Labs (all labs ordered are listed, but only abnormal results are displayed) Labs Reviewed - No data to display  EKG None  Radiology No results  found.  Procedures Procedures (including critical care time)  Medications Ordered in ED Medications  0.9 %  sodium chloride infusion ( Intravenous New Bag/Given 02/27/18 1438)  glucagon (human recombinant) (GLUCAGEN) injection 1 mg (1 mg Intravenous Given 02/27/18 1524)     Initial Impression / Assessment and Plan / ED Course  I have reviewed the triage vital signs and the nursing notes.  Pertinent labs & imaging results that were available during my care of the patient were reviewed by me and considered in my medical decision making (see chart for details).  Clinical Course as of Feb 28 1604  Sun Feb 27, 2018  1520 [facility] call back to inform me that this patient is not active with Dr. Ewing SchleinMagod, therefore they would like the unassigned GI physician contacted.  This will be done by [employer] operational officersecretary.   [EW]  1528 No relief of symptoms, with first dose of glucagon second dose now being given.   [EW]  1529 Dr. Myrtie Neitheranis called back and will arrange to see and treat the patient   [EW]  1601 Patient remains symptomatic, and is currently leaving the department to go to endoscopy for food bolus removal   [EW]    Clinical Course User Index [EW] Mancel BaleWentz, Hines Kloss, MD     Patient Vitals for the past 24 hrs:  BP Temp Pulse Resp SpO2  02/27/18 1545 110/64 - 94 16 99 %  02/27/18 1530 111/70 - 88 (!) 27 100 %  02/27/18 1520 123/65 - 83 20 100 %  02/27/18 1430 111/72 - 89 - 100 %  02/27/18 1416 135/82 - 96 18 100 %  02/27/18 1415 135/82 - 96 - 100 %    4:05 PM Reevaluation with update and discussion. After initial assessment and treatment, an updated evaluation reveals he continues to be symptomatic, and is going to endoscopy. Mancel BaleElliott Levis Nazir   MDM-recurrent food bolus obstruction, esophagus, requiring endoscopic removal.  Nursing Notes Reviewed/ Care Coordinated Applicable Imaging Reviewed Interpretation of Laboratory Data incorporated into ED treatment  Anticipate discharge following removal of  esophageal food bolus, per GI.  Final Clinical Impressions(s) / ED Diagnoses   Final diagnoses:  Esophageal obstruction    ED Discharge Orders    None       Mancel BaleWentz, Enya Bureau, MD 02/27/18 1605

## 2018-02-27 NOTE — Anesthesia Procedure Notes (Signed)
Date/Time: 02/27/2018 5:57 PM Performed by: Minerva EndsMirarchi, Meara Wiechman M, CRNA Oxygen Delivery Method: Simple face mask Placement Confirmation: positive ETCO2 and breath sounds checked- equal and bilateral Dental Injury: Teeth and Oropharynx as per pre-operative assessment

## 2018-02-27 NOTE — Discharge Instructions (Signed)
YOU HAD AN ENDOSCOPIC PROCEDURE TODAY: Refer to the procedure report and other information in the discharge instructions given to you for any specific questions about what was found during the examination. If this information does not answer your questions, please call [facility] office at [phone] to clarify.   YOU SHOULD EXPECT: Some feelings of bloating in the abdomen. Passage of more gas than usual. Walking can help get rid of the air that was put into your GI tract during the procedure and reduce the bloating. If you had a lower endoscopy (such as a colonoscopy or flexible sigmoidoscopy) you may notice spotting of blood in your stool or on the toilet paper. Some abdominal soreness may be present for a day or two, also.  DIET: Your first meal following the procedure should be a light meal and then it is ok to progress to your normal diet. A half-sandwich or bowl of soup is an example of a good first meal. Heavy or fried foods are harder to digest and may make you feel nauseous or bloated. Drink plenty of fluids but you should avoid alcoholic beverages for 24 hours. If you had a esophageal dilation, please see attached instructions for diet.    ACTIVITY: Your care partner should take you home directly after the procedure. You should plan to take it easy, moving slowly for the rest of the day. You can resume normal activity the day after the procedure however YOU SHOULD NOT DRIVE, use power tools, machinery or perform tasks that involve climbing or major physical exertion for 24 hours (because of the sedation medicines used during the test).   SYMPTOMS TO REPORT IMMEDIATELY: A gastroenterologist can be reached at any hour. Please call [phone]  for any of the following symptoms:  Following lower endoscopy (colonoscopy, flexible sigmoidoscopy) Excessive amounts of blood in the stool  Significant tenderness, worsening of abdominal pains  Swelling of the abdomen that is new, acute  Fever of 100 or  higher  Following upper endoscopy (EGD, EUS, ERCP, esophageal dilation) Vomiting of blood or coffee ground material  New, significant abdominal pain  New, significant chest pain or pain under the shoulder blades  Painful or persistently difficult swallowing  New shortness of breath  Black, tarry-looking or red, bloody stools  FOLLOW UP:  If any biopsies were taken you will be contacted by phone or by letter within the next 1-3 weeks. Call [phone]  if you have not heard about the biopsies in 3 weeks.  Please also call with any specific questions about appointments or follow up tests.  [facility] in 2-3 weeks.  Call them tomorrow for an appointment.  Soft diet and all meat should be ground.   ____________________________________________________

## 2018-02-27 NOTE — ED Triage Notes (Signed)
Pt presents with chicken food bolus present, event happened about 1230-1245. Pt unable to swallow any secretions,

## 2018-02-27 NOTE — Op Note (Signed)
[facility] Patient Name: Jacob Salazar Procedure Date: 02/27/2018 MRN: [id number] Attending MD: Starr Lake. Myrtie Neither , MD Date of Birth: 11/23/1996 CSN: [id number] Age: 22 Admit Type: Emergency Department Procedure:                Upper GI endoscopy Indications:              Dysphagia, Foreign body in the esophagus (food                            impaction, which has occurred twice before - see                            consult report from today)T Providers:                Starr Lake. Myrtie Neither, MD, Tomma Rakers, RN, Verita Schneiders, Technician, Heron Nay, CRNA Referring MD:              Medicines:                General Anesthesia Complications:            No immediate complications. Estimated Blood Loss:     Estimated blood loss was minimal. Procedure:                Pre-Anesthesia Assessment:                           - Prior to the procedure, a History and Physical                            was performed, and patient medications and                            allergies were reviewed. The patient's tolerance of                            previous anesthesia was also reviewed. The risks                            and benefits of the procedure and the sedation                            options and risks were discussed with the patient.                            All questions were answered, and informed consent                            was obtained. Prior Anticoagulants: The patient has                            taken no previous anticoagulant or antiplatelet  agents. ASA Grade Assessment: II - A patient with                            mild systemic disease. After reviewing the risks                            and benefits, the patient was deemed in                            satisfactory condition to undergo the procedure.                           After obtaining informed consent, the endoscope was                  passed under direct vision. Throughout the                            procedure, the patient's blood pressure, pulse, and                            oxygen saturations were monitored continuously. The                            [id number] ([id number]) scope was introduced through the                            mouth, and advanced to the second part of duodenum.                            The upper GI endoscopy was performed with                            difficulty due to presence of food. The patient                            tolerated the procedure well. Scope In: Scope Out: Findings:      Food was found in the upper third of the esophagus. Removal of food was       accomplished after 45 minutes of effort with multiple devices. Some of       the meat was removed piecemeal, and the remainder pushed into the       stomach.      One moderate benign-appearing, intrinsic stenosis was found 25 cm from       the incisors. This measured 1.2 cm (inner diameter) x 3 cm (in length)       and was traversed. Biopsies were obtained from the proximal and distal       esophagus with cold forceps for histology of suspected eosinophilic       esophagitis due to finding of linear furrowing throughout esophagus.      The stomach was normal.      The cardia and gastric fundus were normal on retroflexion.      The examined duodenum was normal. Impression:               - Food in  the upper third of the esophagus. Removal                            was successful.                           - Benign-appearing esophageal stenosis. Biopsied.                           - Normal stomach.                           - Normal examined duodenum. Moderate Sedation:      GETA Recommendation:           - Soft diet and all meat ground.                           - Discharge patient to home.                           - Return to GI office of Dr. Conley Rolls in 2-3 weeks. Call                            tomorrow for an  appointment.                           - Await pathology results.                           - Continue present medications. Procedure Code(s):        --- Professional ---                           [phone], Esophagogastroduodenoscopy, flexible,                            transoral; with removal of foreign body(s)                           43239, Esophagogastroduodenoscopy, flexible,                            transoral; with biopsy, single or multiple Diagnosis Code(s):        --- Professional ---                           M57.846N, Food in esophagus causing other injury,                            initial encounter                           K22.2, Esophageal obstruction                           R13.10, Dysphagia, unspecified  T18.108A, Unspecified foreign body in esophagus                            causing other injury, initial encounter CPT copyright 2016 [employer]. All rights reserved. The codes documented in this report are preliminary and upon coder review may  be revised to meet current compliance requirements. Kharma Sampsel L. Myrtie Neitheranis, MD 02/27/2018 6:05:39 PM This report has been signed electronically. Number of Addenda: 0

## 2018-02-27 NOTE — ED Notes (Signed)
Assumed care of patient from ED, report rec from ED RN. Pt tx to Endo Dept via stretcher, sr x 2 up. Monitored pt in Endo Dept, Anesthesia Team at bedside. Mother at side. Awaiting Endo Staff arrival

## 2018-02-27 NOTE — Anesthesia Procedure Notes (Signed)
Procedure Name: Intubation Date/Time: 02/27/2018 4:45 PM Performed by: Minerva EndsMirarchi, Allyne Hebert M, CRNA Pre-anesthesia Checklist: Patient identified, Emergency Drugs available, Suction available and Patient being monitored Patient Re-evaluated:Patient Re-evaluated prior to induction Oxygen Delivery Method: Circle System Utilized Preoxygenation: Pre-oxygenation with 100% oxygen Induction Type: IV induction Ventilation: Mask ventilation without difficulty Laryngoscope Size: Miller and 2 Grade View: Grade I Tube type: Oral Number of attempts: 1 Airway Equipment and Method: Stylet Placement Confirmation: ETT inserted through vocal cords under direct vision,  positive ETCO2 and breath sounds checked- equal and bilateral Secured at: 22 cm Tube secured with: Tape Dental Injury: Teeth and Oropharynx as per pre-operative assessment  Comments: Smooth IV induction Fitzgerald-- intubation AM CRNA atraumatic-- teeth and mouth as preop--- bilat BS Sampson GoonFitzgerald

## 2018-02-27 NOTE — Anesthesia Preprocedure Evaluation (Signed)
Anesthesia Evaluation  Patient identified by MRN, date of birth, ID band Patient awake    Reviewed: Allergy & Precautions, H&P , NPO status , Patient's Chart, lab work & pertinent test results  Airway Mallampati: III  TM Distance: >3 FB Neck ROM: Full    Dental no notable dental hx. (+) Teeth Intact, Dental Advisory Given   Pulmonary neg pulmonary ROS,    Pulmonary exam normal breath sounds clear to auscultation       Cardiovascular negative cardio ROS   Rhythm:Regular Rate:Normal     Neuro/Psych Anxiety Depression negative neurological ROS     GI/Hepatic negative GI ROS, Neg liver ROS,   Endo/Other  negative endocrine ROS  Renal/GU negative Renal ROS  negative genitourinary   Musculoskeletal   Abdominal   Peds  Hematology negative hematology ROS (+)   Anesthesia Other Findings   Reproductive/Obstetrics negative OB ROS                             Anesthesia Physical Anesthesia Plan  ASA: II and emergent  Anesthesia Plan: General   Post-op Pain Management:    Induction: Intravenous, Rapid sequence and Cricoid pressure planned  PONV Risk Score and Plan: 2 and Ondansetron and Treatment may vary due to age or medical condition  Airway Management Planned: Oral ETT  Additional Equipment:   Intra-op Plan:   Post-operative Plan: Extubation in OR  Informed Consent: I have reviewed the patients History and Physical, chart, labs and discussed the procedure including the risks, benefits and alternatives for the proposed anesthesia with the patient or authorized representative who has indicated his/her understanding and acceptance.   Dental advisory given  Plan Discussed with: CRNA  Anesthesia Plan Comments:         Anesthesia Quick Evaluation

## 2018-02-27 NOTE — ED Notes (Signed)
ED TO INPATIENT HANDOFF REPORT  Name/Age/Gender Jacob Salazar 22 y.o. male  Code Status   Home/SNF/Other Home  Chief Complaint foreign body in throat  Level of Care/Admitting Diagnosis ED Disposition    None      Medical History Past Medical History:  Diagnosis Date  . Anxiety   . Depression   . Esophageal stricture   . Mood disorder (HCC)     Allergies Allergies  Allergen Reactions  . Grassleaf Sweetflag Rhizome   . Latex Anaphylaxis    IV Location/Drains/Wounds Patient Lines/Drains/Airways Status   Active Line/Drains/Airways    Name:   Placement date:   Placement time:   Site:   Days:   Peripheral IV 02/27/18 Right Antecubital   02/27/18    1424    Antecubital   less than 1          Labs/Imaging No results found for this or any previous visit (from the past 48 hour(s)). No results found.  Pending Labs Unresulted Labs (From admission, onward)   None      Vitals/Pain Today's Vitals   02/27/18 1430 02/27/18 1520 02/27/18 1530 02/27/18 1545  BP: 111/72 123/65 111/70 110/64  Pulse: 89 83 88 94  Resp:  20 (!) 27 16  SpO2: 100% 100% 100% 99%  PainSc:        Isolation Precautions No active isolations  Medications Medications  0.9 %  sodium chloride infusion ( Intravenous New Bag/Given 02/27/18 1438)  glucagon (human recombinant) (GLUCAGEN) injection 1 mg (1 mg Intravenous Given 02/27/18 1524)    Mobility walks

## 2018-02-27 NOTE — ED Notes (Signed)
Pre-Procedure chart review for PACU

## 2018-02-27 NOTE — Interval H&P Note (Signed)
History and Physical Interval Note:  02/27/2018 4:24 PM  Jacob Salazar  has presented today for surgery, with the diagnosis of esophageal food impaction  The various methods of treatment have been discussed with the patient and family. After consideration of risks, benefits and other options for treatment, the patient has consented to  Procedure(s): ESOPHAGOGASTRODUODENOSCOPY (EGD) (N/A) as a surgical intervention .  The patient's history has been reviewed, patient examined, no change in status, stable for surgery.  I have reviewed the patient's chart and labs.  Questions were answered to the patient's satisfaction.     Charlie PitterHenry L Danis III

## 2018-02-27 NOTE — Transfer of Care (Signed)
Immediate Anesthesia Transfer of Care Note  Patient: Chancy MilroyMatthew Crimi  Procedure(s) Performed: ESOPHAGOGASTRODUODENOSCOPY (EGD) (N/A )  Patient Location: PACU  Anesthesia Type:General  Level of Consciousness: sedated  Airway & Oxygen Therapy: Patient Spontanous Breathing and Patient connected to face mask oxygen  Post-op Assessment: Report given to RN and Post -op Vital signs reviewed and stable  Post vital signs: Reviewed and stable  Last Vitals:  Vitals Value Taken Time  BP    Temp    Pulse 89 02/27/2018  6:06 PM  Resp 24 02/27/2018  6:06 PM  SpO2 99 % 02/27/2018  6:06 PM  Vitals shown include unvalidated device data.  Last Pain:  Vitals:   02/27/18 1627  TempSrc: Oral  PainSc:          Complications: No apparent anesthesia complications

## 2018-02-27 NOTE — Progress Notes (Signed)
PACU NURSING NOTE: PACU DC note... Pt ready for DC to home post "removal of food impaction" per MD orders, pt alert and oriented, MAE x 4, voices no complaints, able to tolerate liquids, no dysphagia or dysphonia noted, BS clear bilaterally, tracheal sounds WNL. DC instructions provided to mother, pt teaching done re: activity level and diet post general anesthesia. Also, discussed times to return to ED or call GI MD on call, I.e. Excessive vomiting, unable to swallow. Also reminded mother and pt to make follow up MD appointment and to keep that appointment. Mother, per her request, provided [facility] number and address. Pt ready to get into wheelchair, upon standing pt immediately vomited x 2 large amt of thin brownish, gastric like contents. Pt stated that upon vomiting felt much better and had no further complaints of nausea or abdominal pain. Pt returned back to stretcher to be observed and monitored, pt requested soda, given sm amt, which tolerated well. After monitoring pt for 15-4520min, with no further signs of N/V noted, pt escorted with mother to POV. Opportunity for questions or voicing concerns provided. Teach Back Method used for pt teaching.

## 2018-02-27 NOTE — Consult Note (Signed)
[facility] Gastroenterology Consult Note   History Jacob Salazar MRN # [id number]  Date of Admission: 02/27/2018 Date of Consultation: 02/27/2018 Referring physician: Dr. Mancel Bale, MD Primary Care Provider: Cecile Hearing., MD Primary Gastroenterologist: Charise Carwin, MD   Reason for Consultation/Chief Complaint: Dysphagia/esophageal food impaction  Subjective  HPI:  This is a 22 year old man with a previous history of esophageal food impaction who was brought to the ED today by his mother for acute dysphagia.  He was eating meat and it felt immediately stuck in the neck and he is been able to swallow secretions.  He can talk and he is breathing comfortably.  He had a similar episode in April 2018, at which time EGD was performed by Dr. Vida Rigger.  There was noted to be mild stricturing, no biopsies taken.  Patient had another episode in November 2018 where it resolved in the ED without endoscopy.  He apparently continues acid suppression after an office consultation with the GI doctor noted above in May 2018.  He has not had a repeat upper endoscopy by the physician. He has a history of psychiatric disorder with diagnosis of bipolar and autism spectrum and is on multiple psychiatric medicines.  ROS:  No recent weight loss Psychiatric:   Chronic anxiety It is difficult to get any other review of systems the patient since he speaks very little and defers questions to his mother. All other systems are negative except as noted above in the HPI, as near as can be determined  Past Medical History Past Medical History:  Diagnosis Date  . Anxiety   . Depression   . Esophageal stricture   . Mood disorder [facility])     Past Surgical History Past Surgical History:  Procedure Laterality Date  . ESOPHAGOGASTRODUODENOSCOPY N/A 03/12/2017   Procedure: ESOPHAGOGASTRODUODENOSCOPY (EGD);  Surgeon: Vida Rigger, MD;  Location: [facility];  Service: Endoscopy;  Laterality: N/A;    Family  History Family History  Problem Relation Age of Onset  . Depression Mother     Social History Social History   Socioeconomic History  . Marital status: Unknown    Spouse name: Not on file  . Number of children: Not on file  . Years of education: Not on file  . Highest education level: Not on file  Occupational History  . Not on file  Social Needs  . Financial resource strain: Not on file  . Food insecurity:    Worry: Not on file    Inability: Not on file  . Transportation needs:    Medical: Not on file    Non-medical: Not on file  Tobacco Use  . Smoking status: Never Smoker  . Smokeless tobacco: Never Used  Substance and Sexual Activity  . Alcohol use: No  . Drug use: No  . Sexual activity: Never  Lifestyle  . Physical activity:    Days per week: Not on file    Minutes per session: Not on file  . Stress: Not on file  Relationships  . Social connections:    Talks on phone: Not on file    Gets together: Not on file    Attends religious service: Not on file    Active member of club or organization: Not on file    Attends meetings of clubs or organizations: Not on file    Relationship status: Not on file  Other Topics Concern  . Not on file  Social History Narrative  . Not on file    Allergies  Allergies  Allergen Reactions  . Grassleaf Sweetflag Rhizome   . Latex Anaphylaxis    Outpatient Meds Home medications from the H+P and/or nursing med reconciliation reviewed.  Inpatient med list reviewed  _____________________________________________________________________ Objective   Exam:  Current vital signs  Patient Vitals for the past 8 hrs:  BP Temp Pulse Resp SpO2  02/27/18 1545 110/64 - 94 16 99 %  02/27/18 1530 111/70 - 88 (!) 27 100 %  02/27/18 1520 123/65 - 83 20 100 %  02/27/18 1430 111/72 - 89 - 100 %  02/27/18 1416 135/82 - 96 18 100 %  02/27/18 1415 135/82 - 96 - 100 %   No intake or output data in the 24 hours ending 02/27/18  1602  Physical Exam:    General: this is a healthy-appearing young male patient , breathing comfortably, speaks very little, poor eye contact, cannot handle secretions and is spitting them into a container.  Eyes: sclera anicteric, no redness  ENT: oral mucosa moist without lesions, no cervical or supraclavicular lymphadenopathy, good dentition  CV: RRR without murmur, S1/S2, no JVD,, no peripheral edema  Resp: clear to auscultation bilaterally, normal RR and effort noted  GI: soft, no tenderness, with active bowel sounds. No guarding or palpable organomegaly noted  Skin; warm and dry, no rash or jaundice noted  Neuro: awake, alert and oriented x 3. Normal gross motor function and fluent speech.  Labs:  No results for input(s): WBC, HGB, HCT, PLT in the last 168 hours. No results for input(s): NA, K, CL, CO2, BUN, ALBUMIN, ALKPHOS, ALT, AST, GLUCOSE in the last 168 hours.  Invalid input(s): CREAT, BILIRUBIN No results for input(s): INR in the last 168 hours.  EGD report from April 2018 reviewed   @ASSESSMENTPLANBEGIN @ Impression:  Acute dysphagia Esophageal food impaction  This raises the suspicion for eosinophilic esophagitis   Plan:  Upper endoscopy now with general anesthesia for airway protection given the probable proximal esophageal location of this impaction as well as the patient's psychiatric illness and treatment. I discussed this with him and his mother.  His mother acts as his medical proxy and has given permission.  Risks and benefits were completely reviewed.  The benefits and risks of the planned procedure were described in detail with the patient or (when appropriate) their health care proxy.  Risks were outlined as including, but not limited to, bleeding, infection, perforation, adverse medication reaction leading to cardiac or pulmonary decompensation, or pancreatitis (if ERCP).  The limitation of incomplete mucosal visualization was also discussed.  No  guarantees or warranties were given.  I might take biopsies if there are changes consistent with eosinophilic esophagitis.  He will then follow-up with his primary gastroenterologist in [facility].  His mother cannot recall that person's name, so I have given her the name and office address and contact number to arrange follow-up.    Thank you for the courtesy of this consult.  Please contact me with any questions or concerns.  Jacob Salazar Pager: [phone] Mon-Fri 8a-5p [phone] after 5p, weekends, holidays

## 2018-02-27 NOTE — H&P (View-Only) (Signed)
[facility] Gastroenterology Consult Note   History Jacob Salazar MRN # [id number]  Date of Admission: 02/27/2018 Date of Consultation: 02/27/2018 Referring physician: Dr. Mancel Bale, MD Primary Care Provider: Cecile Hearing., MD Primary Gastroenterologist: Charise Carwin, MD   Reason for Consultation/Chief Complaint: Dysphagia/esophageal food impaction  Subjective  HPI:  This is a 22 year old man with a previous history of esophageal food impaction who was brought to the ED today by his mother for acute dysphagia.  He was eating meat and it felt immediately stuck in the neck and he is been able to swallow secretions.  He can talk and he is breathing comfortably.  He had a similar episode in April 2018, at which time EGD was performed by Dr. Vida Rigger.  There was noted to be mild stricturing, no biopsies taken.  Patient had another episode in November 2018 where it resolved in the ED without endoscopy.  He apparently continues acid suppression after an office consultation with the GI doctor noted above in May 2018.  He has not had a repeat upper endoscopy by the physician. He has a history of psychiatric disorder with diagnosis of bipolar and autism spectrum and is on multiple psychiatric medicines.  ROS:  No recent weight loss Psychiatric:   Chronic anxiety It is difficult to get any other review of systems the patient since he speaks very little and defers questions to his mother. All other systems are negative except as noted above in the HPI, as near as can be determined  Past Medical History Past Medical History:  Diagnosis Date  . Anxiety   . Depression   . Esophageal stricture   . Mood disorder [facility])     Past Surgical History Past Surgical History:  Procedure Laterality Date  . ESOPHAGOGASTRODUODENOSCOPY N/A 03/12/2017   Procedure: ESOPHAGOGASTRODUODENOSCOPY (EGD);  Surgeon: Vida Rigger, MD;  Location: [facility];  Service: Endoscopy;  Laterality: N/A;    Family  History Family History  Problem Relation Age of Onset  . Depression Mother     Social History Social History   Socioeconomic History  . Marital status: Unknown    Spouse name: Not on file  . Number of children: Not on file  . Years of education: Not on file  . Highest education level: Not on file  Occupational History  . Not on file  Social Needs  . Financial resource strain: Not on file  . Food insecurity:    Worry: Not on file    Inability: Not on file  . Transportation needs:    Medical: Not on file    Non-medical: Not on file  Tobacco Use  . Smoking status: Never Smoker  . Smokeless tobacco: Never Used  Substance and Sexual Activity  . Alcohol use: No  . Drug use: No  . Sexual activity: Never  Lifestyle  . Physical activity:    Days per week: Not on file    Minutes per session: Not on file  . Stress: Not on file  Relationships  . Social connections:    Talks on phone: Not on file    Gets together: Not on file    Attends religious service: Not on file    Active member of club or organization: Not on file    Attends meetings of clubs or organizations: Not on file    Relationship status: Not on file  Other Topics Concern  . Not on file  Social History Narrative  . Not on file    Allergies  Allergies  Allergen Reactions  . Grassleaf Sweetflag Rhizome   . Latex Anaphylaxis    Outpatient Meds Home medications from the H+P and/or nursing med reconciliation reviewed.  Inpatient med list reviewed  _____________________________________________________________________ Objective   Exam:  Current vital signs  Patient Vitals for the past 8 hrs:  BP Temp Pulse Resp SpO2  02/27/18 1545 110/64 - 94 16 99 %  02/27/18 1530 111/70 - 88 (!) 27 100 %  02/27/18 1520 123/65 - 83 20 100 %  02/27/18 1430 111/72 - 89 - 100 %  02/27/18 1416 135/82 - 96 18 100 %  02/27/18 1415 135/82 - 96 - 100 %   No intake or output data in the 24 hours ending 02/27/18  1602  Physical Exam:    General: this is a healthy-appearing young male patient , breathing comfortably, speaks very little, poor eye contact, cannot handle secretions and is spitting them into a container.  Eyes: sclera anicteric, no redness  ENT: oral mucosa moist without lesions, no cervical or supraclavicular lymphadenopathy, good dentition  CV: RRR without murmur, S1/S2, no JVD,, no peripheral edema  Resp: clear to auscultation bilaterally, normal RR and effort noted  GI: soft, no tenderness, with active bowel sounds. No guarding or palpable organomegaly noted  Skin; warm and dry, no rash or jaundice noted  Neuro: awake, alert and oriented x 3. Normal gross motor function and fluent speech.  Labs:  No results for input(s): WBC, HGB, HCT, PLT in the last 168 hours. No results for input(s): NA, K, CL, CO2, BUN, ALBUMIN, ALKPHOS, ALT, AST, GLUCOSE in the last 168 hours.  Invalid input(s): CREAT, BILIRUBIN No results for input(s): INR in the last 168 hours.  EGD report from April 2018 reviewed   @ASSESSMENTPLANBEGIN @ Impression:  Acute dysphagia Esophageal food impaction  This raises the suspicion for eosinophilic esophagitis   Plan:  Upper endoscopy now with general anesthesia for airway protection given the probable proximal esophageal location of this impaction as well as the patient's psychiatric illness and treatment. I discussed this with him and his mother.  His mother acts as his medical proxy and has given permission.  Risks and benefits were completely reviewed.  The benefits and risks of the planned procedure were described in detail with the patient or (when appropriate) their health care proxy.  Risks were outlined as including, but not limited to, bleeding, infection, perforation, adverse medication reaction leading to cardiac or pulmonary decompensation, or pancreatitis (if ERCP).  The limitation of incomplete mucosal visualization was also discussed.  No  guarantees or warranties were given.  I might take biopsies if there are changes consistent with eosinophilic esophagitis.  He will then follow-up with his primary gastroenterologist in [facility].  His mother cannot recall that person's name, so I have given her the name and office address and contact number to arrange follow-up.    Thank you for the courtesy of this consult.  Please contact me with any questions or concerns.  Charlie PitterHenry L Danis III Pager: [phone] Mon-Fri 8a-5p [phone] after 5p, weekends, holidays

## 2018-02-28 ENCOUNTER — Encounter (HOSPITAL_COMMUNITY): Payer: Self-pay | Admitting: Gastroenterology

## 2018-03-01 NOTE — Anesthesia Postprocedure Evaluation (Signed)
Anesthesia Post Note  Patient: Jacob MilroyMatthew Salazar  Procedure(s) Performed: ESOPHAGOGASTRODUODENOSCOPY (EGD) (N/A )     Patient location during evaluation: PACU Anesthesia Type: General Level of consciousness: awake and alert Pain management: pain level controlled Vital Signs Assessment: post-procedure vital signs reviewed and stable Respiratory status: spontaneous breathing, nonlabored ventilation and respiratory function stable Cardiovascular status: blood pressure returned to baseline and stable Postop Assessment: no apparent nausea or vomiting Anesthetic complications: no    Last Vitals:  Vitals:   02/27/18 1830 02/27/18 1900  BP: 120/64 117/62  Pulse:  80  Resp:  14  Temp:    SpO2: 95% 96%    Last Pain:  Vitals:   02/27/18 1900  TempSrc:   PainSc: 0-No pain                 Chandler Stofer,W. EDMOND

## 2018-07-20 IMAGING — CR DG NECK SOFT TISSUE
2 series · 2 of 2 positions shown · non-contrast
Comparison: None.

CLINICAL DATA: Patient swallowed a piece of steak and got stuck in
esophagus. Patient is unable to swallow saliva and having esophageal
pain.

EXAM:
NECK SOFT TISSUES - 1+ VIEW

[w soft tissue neck]
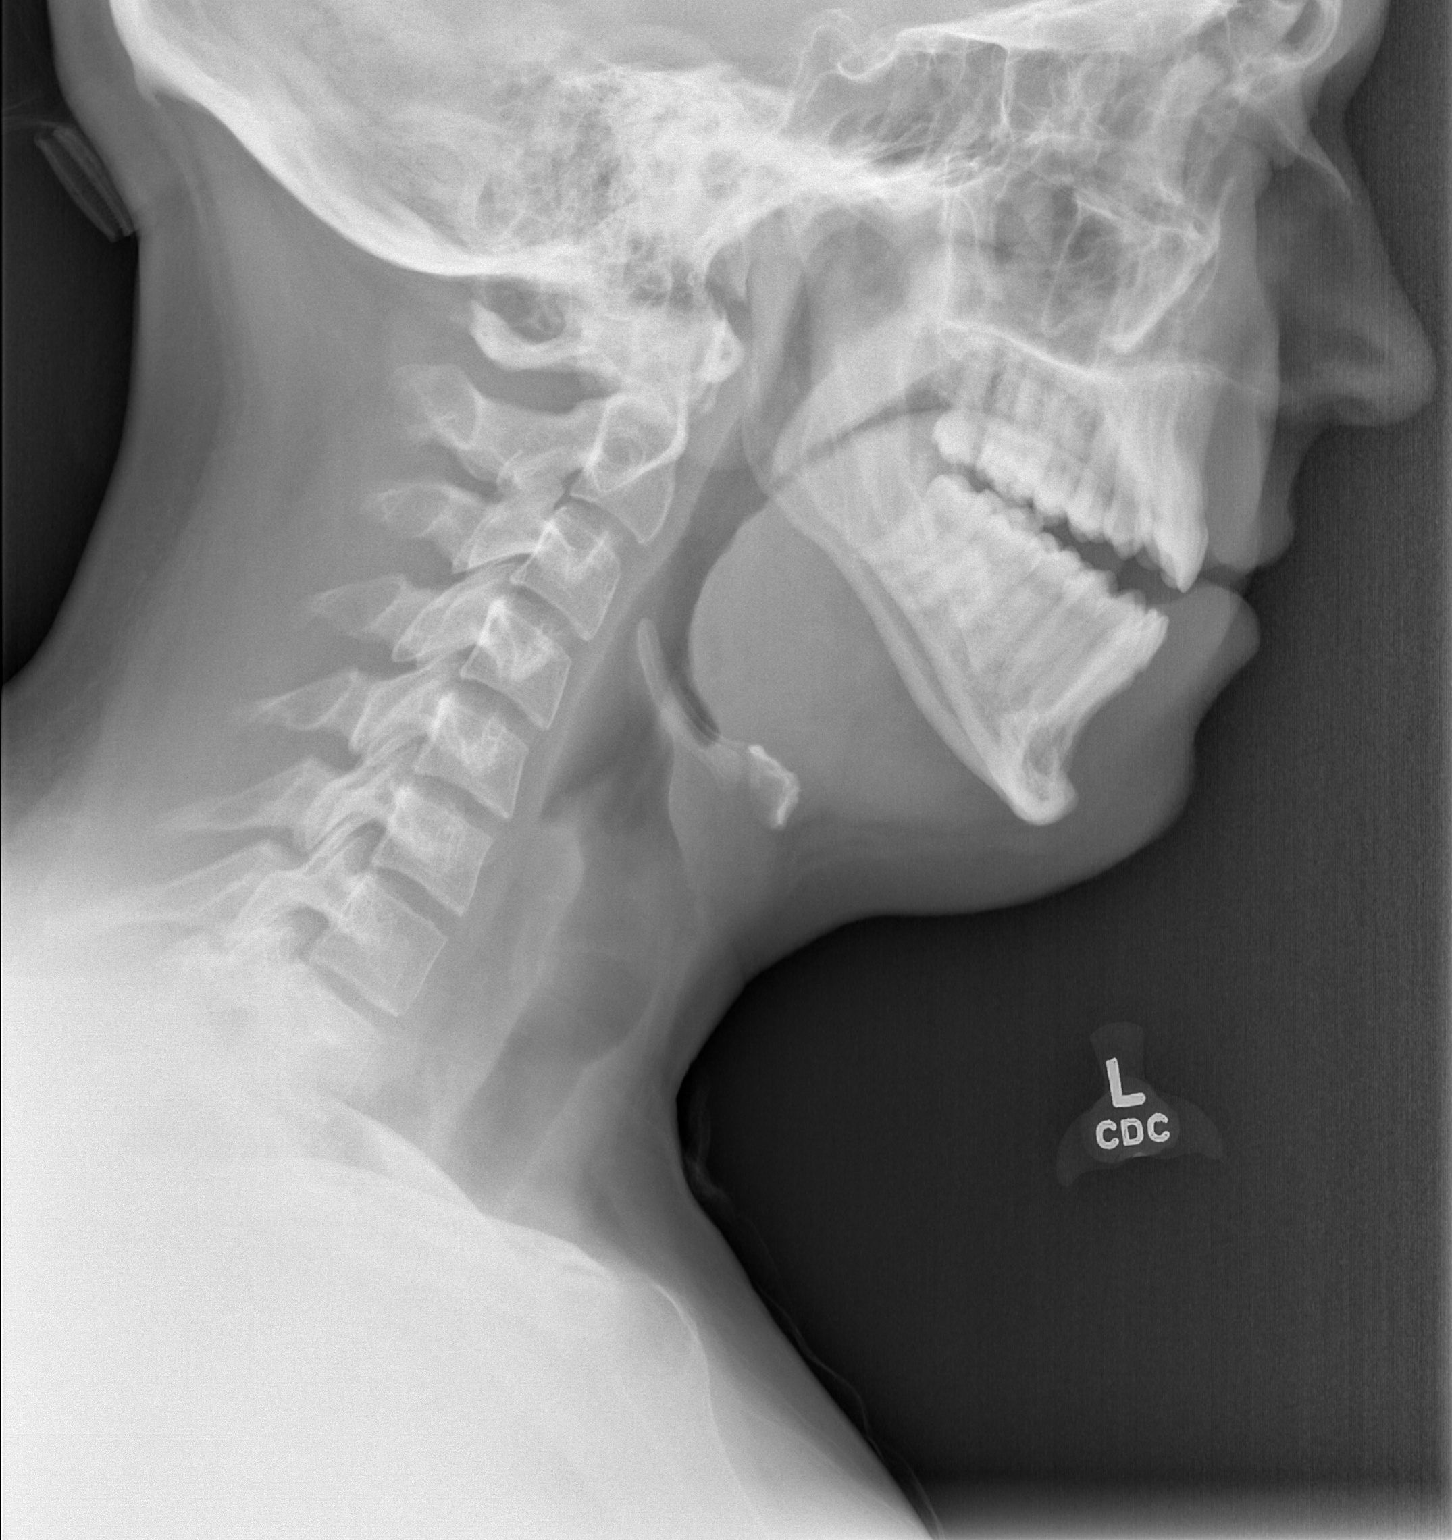

[w soft tissue neck ap]
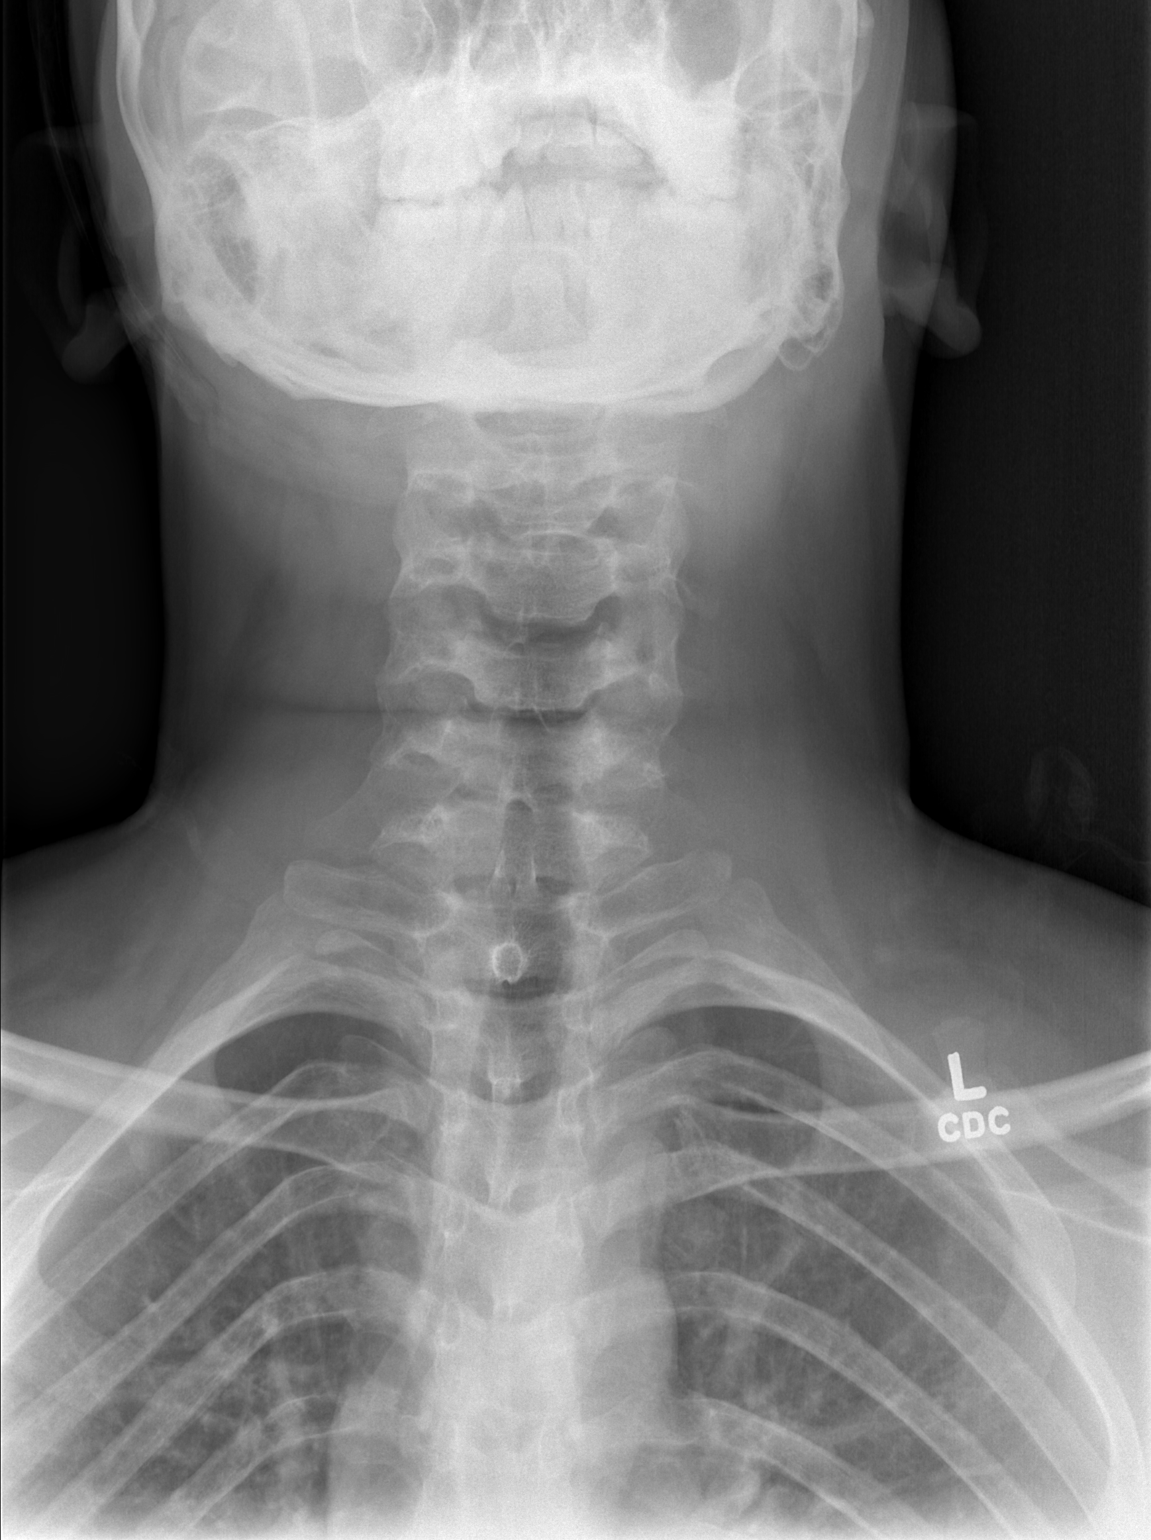

[2 of 2 positions shown; findings below may reference images not displayed]

FINDINGS: There is no evidence of retropharyngeal soft tissue swelling or
epiglottic enlargement. The cervical airway is unremarkable and no
radio-opaque foreign body identified.
IMPRESSION: Negative.
